# Patient Record
Sex: Female | Born: 1970 | Race: Black or African American | Hispanic: No | Marital: Single | Smoking: Never smoker
Health system: Southern US, Community
[De-identification: ages and names within clinical notes are randomized; demographics above are authoritative.]

## PROBLEM LIST (undated history)

## (undated) DIAGNOSIS — G8929 Other chronic pain: Secondary | ICD-10-CM

## (undated) DIAGNOSIS — R51 Headache: Secondary | ICD-10-CM

---

## 2014-01-29 ENCOUNTER — Emergency Department (HOSPITAL_BASED_OUTPATIENT_CLINIC_OR_DEPARTMENT_OTHER)
Admission: EM | Admit: 2014-01-29 | Discharge: 2014-01-30 | Disposition: A | Discharge status: Home / Self Care | Payer: Self-pay | Attending: Emergency Medicine | Admitting: Emergency Medicine

## 2014-01-29 ENCOUNTER — Encounter (HOSPITAL_BASED_OUTPATIENT_CLINIC_OR_DEPARTMENT_OTHER): Payer: Self-pay | Admitting: Emergency Medicine

## 2014-01-29 DIAGNOSIS — G43801 Other migraine, not intractable, with status migrainosus: Secondary | ICD-10-CM | POA: Insufficient documentation

## 2014-01-29 DIAGNOSIS — Z791 Long term (current) use of non-steroidal anti-inflammatories (NSAID): Secondary | ICD-10-CM | POA: Insufficient documentation

## 2014-01-29 DIAGNOSIS — R51 Headache: Secondary | ICD-10-CM | POA: Insufficient documentation

## 2014-01-29 HISTORY — DX: Other chronic pain: G89.29

## 2014-01-29 HISTORY — DX: Headache: R51

## 2014-01-29 MED ORDER — SODIUM CHLORIDE 0.9 % IV BOLUS (SEPSIS)
1000.0000 mL | Freq: Once | INTRAVENOUS | Status: AC
  Administered 2014-01-29: 1000 mL via INTRAVENOUS

## 2014-01-29 MED ORDER — DEXAMETHASONE SODIUM PHOSPHATE 4 MG/ML IJ SOLN
INTRAMUSCULAR | Status: AC
  Administered 2014-01-29: 10 mg
  Filled 2014-01-29: qty 3

## 2014-01-29 MED ORDER — DIPHENHYDRAMINE HCL 50 MG/ML IJ SOLN
25.0000 mg | Freq: Once | INTRAMUSCULAR | Status: AC
  Administered 2014-01-29: 25 mg via INTRAVENOUS
  Filled 2014-01-29: qty 1

## 2014-01-29 MED ORDER — METOCLOPRAMIDE HCL 5 MG/ML IJ SOLN
10.0000 mg | Freq: Once | INTRAMUSCULAR | Status: AC
  Administered 2014-01-29: 10 mg via INTRAVENOUS
  Filled 2014-01-29: qty 2

## 2014-01-29 MED ORDER — METOCLOPRAMIDE HCL 10 MG PO TABS: 10.0000 mg | ORAL_TABLET | Freq: Four times a day (QID) | ORAL | Status: AC

## 2014-01-29 MED ORDER — KETOROLAC TROMETHAMINE 30 MG/ML IJ SOLN
30.0000 mg | Freq: Once | INTRAMUSCULAR | Status: AC
  Administered 2014-01-29: 30 mg via INTRAVENOUS
  Filled 2014-01-29: qty 1

## 2014-01-29 MED ORDER — DEXAMETHASONE SODIUM PHOSPHATE 10 MG/ML IJ SOLN: 10.0000 mg | Freq: Once | INTRAMUSCULAR | Status: AC

## 2014-01-29 MED ORDER — NAPROXEN 500 MG PO TABS: 500.0000 mg | ORAL_TABLET | Freq: Two times a day (BID) | ORAL | Status: AC

## 2014-01-29 NOTE — ED Notes (Signed)
Patient here with persistent headache x 5 days, reports she has headaches but never last this long. Denies trauma. Alert and oriented. Reports nausea with same

## 2014-01-29 NOTE — ED Provider Notes (Addendum)
CSN: [id number]     Arrival date & time 01/29/14  1844 History  This chart was scribed for Rolland Porter, MD by Modena Jansky, ED Scribe. This patient was seen in room MH07/MH07 and the patient's care was started at 7:38 PM.   Chief Complaint  Patient presents with  . Headache   The history is provided by the patient. No language interpreter was used.   HPI Comments: Ashlee Moyer is a 43 y.o. female who presents to the Emergency Department complaining of constant moderate headache that started about 5 days ago. She states that she started seeing dots and having nausea when the headache first started. She denies any injury. She reports feeling pain in the back of her eyes and head. She currently rates the severity of her pain as a 8/10. She states that she took imitrex and OTC medication without relief. She reports a prior hx of headache, but never one lasting as long as today's episode. She reports associated photophobia. She denies any emesis, fever, or sore throat.   Past Medical History  Diagnosis Date  . Chronic headaches    History reviewed. No pertinent past surgical history. No family history on file. History  Substance Use Topics  . Smoking status: Never Smoker   . Smokeless tobacco: Not on file  . Alcohol Use: Not on file   OB History   Grav Para Term Preterm Abortions TAB SAB Ect Mult Living                 Review of Systems  Constitutional: Negative for fever, chills, diaphoresis, appetite change and fatigue.  HENT: Negative for mouth sores, sore throat and trouble swallowing.   Eyes: Positive for photophobia. Negative for visual disturbance.  Respiratory: Negative for cough, chest tightness, shortness of breath and wheezing.   Cardiovascular: Negative for chest pain.  Gastrointestinal: Positive for nausea. Negative for vomiting, abdominal pain, diarrhea and abdominal distention.  Endocrine: Negative for polydipsia, polyphagia and polyuria.  Genitourinary: Negative for  dysuria, frequency and hematuria.  Musculoskeletal: Negative for gait problem.  Skin: Negative for color change, pallor and rash.  Neurological: Negative for dizziness, syncope, light-headedness and headaches.  Hematological: Does not bruise/bleed easily.  Psychiatric/Behavioral: Negative for behavioral problems and confusion.      Allergies  Review of patient's allergies indicates no known allergies.  Home Medications   Prior to Admission medications   Medication Sig Start Date End Date Taking? Authorizing Provider  metoCLOPramide (REGLAN) 10 MG tablet Take 1 tablet (10 mg total) by mouth every 6 (six) hours. 01/29/14   Rolland Porter, MD  naproxen (NAPROSYN) 500 MG tablet Take 1 tablet (500 mg total) by mouth 2 (two) times daily. 01/29/14   Rolland Porter, MD   BP 147/74  Pulse 79  Temp(Src) 98.4 F (36.9 C) (Oral)  Resp 18  Wt 250 lb (113.399 kg)  SpO2 98% Physical Exam  Constitutional: She is oriented to person, place, and time. She appears well-developed and well-nourished. No distress.  HENT:  Head: Normocephalic.  Bifrontal and occipital tenderness. Photophobia.   Eyes: Conjunctivae are normal. Pupils are equal, round, and reactive to light. No scleral icterus.  3 mm reactive  Neck: Normal range of motion. Neck supple. No thyromegaly present.  Cardiovascular: Normal rate and regular rhythm.  Exam reveals no gallop and no friction rub.   No murmur heard. Pulmonary/Chest: Effort normal and breath sounds normal. No respiratory distress. She has no wheezes. She has no rales.  Abdominal: Soft. Bowel  sounds are normal. She exhibits no distension. There is no tenderness. There is no rebound.  Musculoskeletal: Normal range of motion.  Neurological: She is alert and oriented to person, place, and time.  Skin: Skin is warm and dry. No rash noted.  Psychiatric: She has a normal mood and affect. Her behavior is normal.    ED Course  Procedures (including critical care time) DIAGNOSTIC  STUDIES: Oxygen Saturation is 98% on RA, normal by my interpretation.    COORDINATION OF CARE: 7:42 PM- Pt advised of plan for treatment which includes medication and pt agrees.  Labs Review Labs Reviewed - No data to display  Imaging Review No results found.   EKG Interpretation None      MDM   Final diagnoses:  Other migraine with status migrainosus, not intractable    Complete relief after above medications and fluids. Plan is discharge home.  I personally performed the services described in this documentation, which was scribed in my presence. The recorded information has been reviewed and is accurate.     Rolland Porter, MD 01/29/14 9147  Rolland Porter, MD 01/29/14 [phone]

## 2014-01-29 NOTE — Discharge Instructions (Signed)

## 2020-10-24 ENCOUNTER — Other Ambulatory Visit: Payer: Self-pay | Admitting: Physician Assistant

## 2020-10-24 DIAGNOSIS — M2392 Unspecified internal derangement of left knee: Secondary | ICD-10-CM

## 2020-10-24 DIAGNOSIS — M2242 Chondromalacia patellae, left knee: Secondary | ICD-10-CM

## 2020-10-30 ENCOUNTER — Other Ambulatory Visit: Payer: Self-pay

## 2020-11-27 ENCOUNTER — Ambulatory Visit
Admission: RE | Admit: 2020-11-27 | Discharge: 2020-11-27 | Disposition: A | Discharge status: Home / Self Care | Payer: Medicaid Other | Source: Ambulatory Visit | Attending: Physician Assistant | Admitting: Physician Assistant

## 2020-11-27 ENCOUNTER — Other Ambulatory Visit: Payer: Self-pay

## 2020-11-27 DIAGNOSIS — M2392 Unspecified internal derangement of left knee: Secondary | ICD-10-CM

## 2020-11-27 DIAGNOSIS — M2242 Chondromalacia patellae, left knee: Secondary | ICD-10-CM

## 2022-07-11 IMAGING — MR MR KNEE*L* W/O CM
4 of 7 series · 21 of 40 positions shown · non-contrast
Comparison: Left knee x-rays dated June 15, 2020, and [facility]

CLINICAL DATA: Chronic left knee pain and swelling around the
patella for the past year. No injury or prior surgery.

EXAM:
MRI OF THE LEFT KNEE WITHOUT CONTRAST
TECHNIQUE: Multiplanar, multisequence MR imaging of the knee was performed. No
intravenous contrast was administered.

[Series 3: T2 fat-sat · axial · 4.0mm · 0.50mm/px · z∈[-38,+77]mm · 4 of 24 slices shown]
[im 1/24]
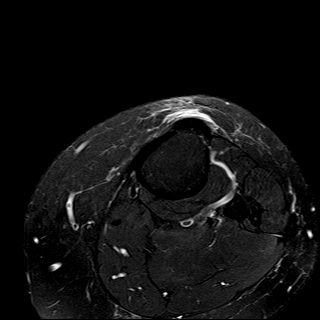
[im 5/24]
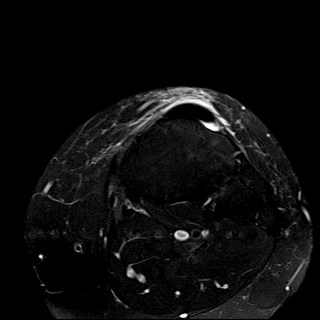
[im 14/24]
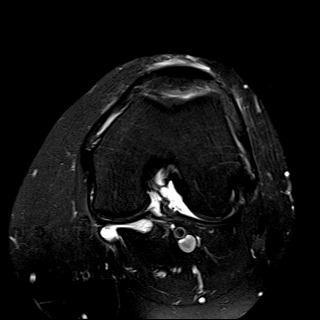
[im 24/24]
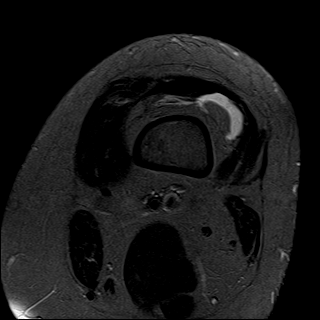

[Series 7: PD fat-sat · sagittal · 3.0mm · 0.29mm/px · 7 of 27 slices shown (1 of 3)]
[im 1/27]
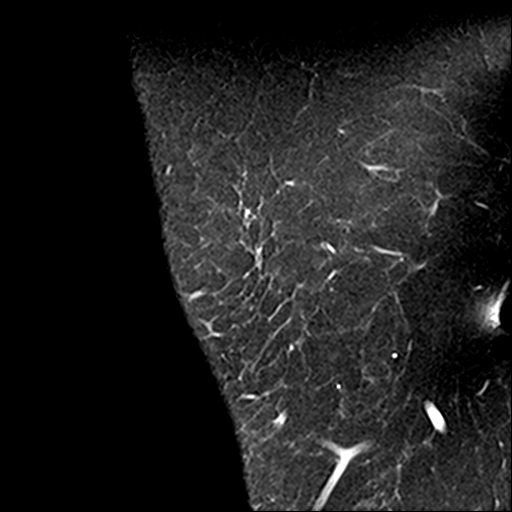
[im 5/27]
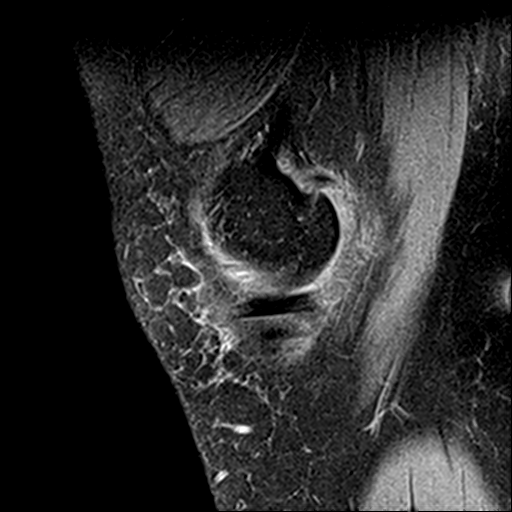
[im 9/27]
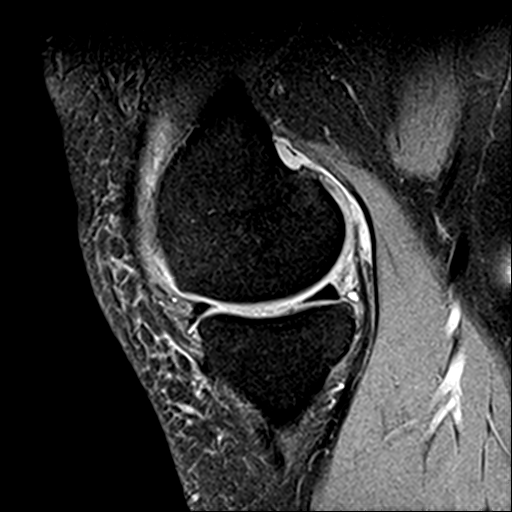
[im 14/27]
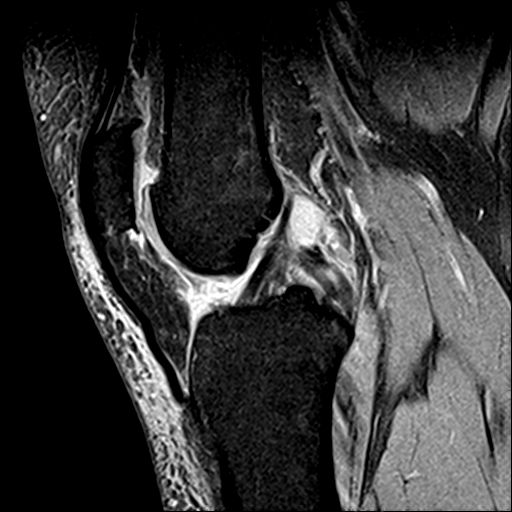
[im 18/27]
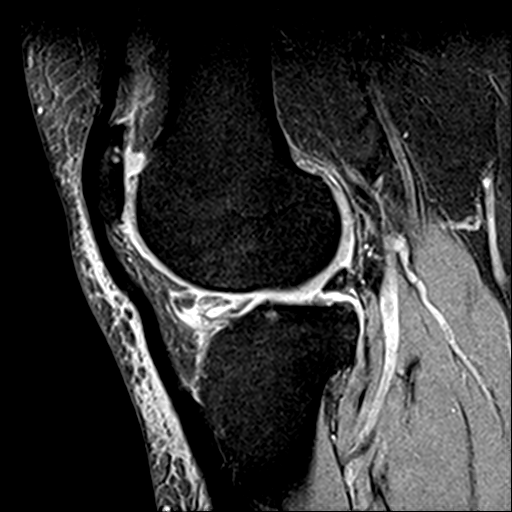
[im 22/27]
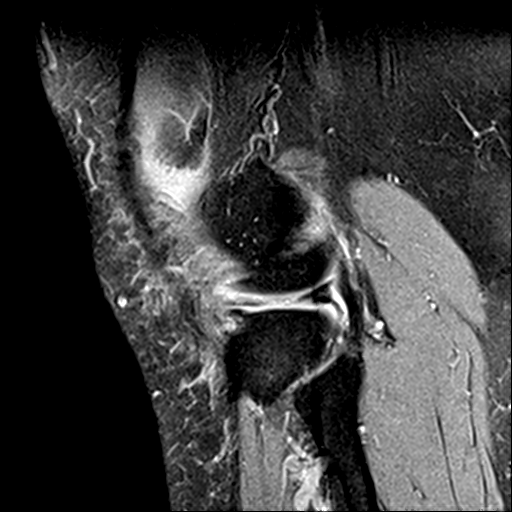
[im 27/27]
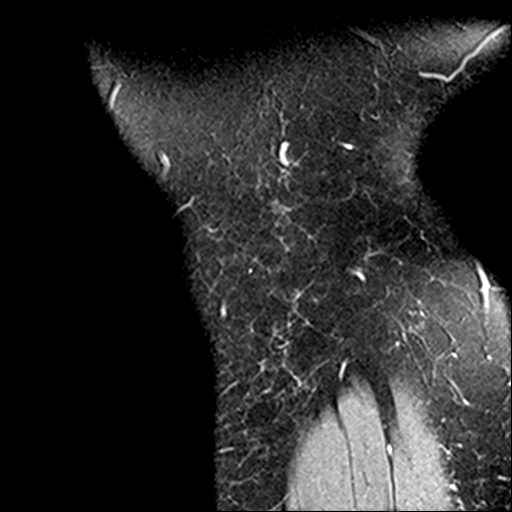

[Series 8: PD fat-sat · coronal · 3.0mm · 0.29mm/px · 7 of 28 slices shown (2 of 3)]
[im 1/28]
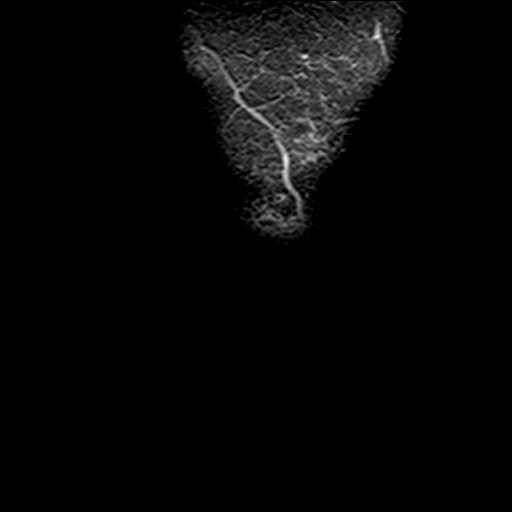
[im 5/28]
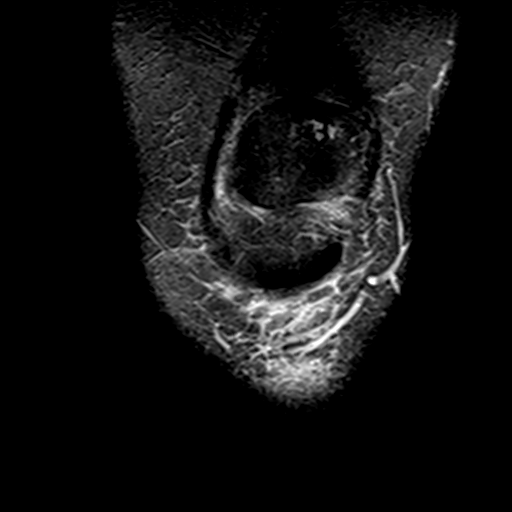
[im 10/28]
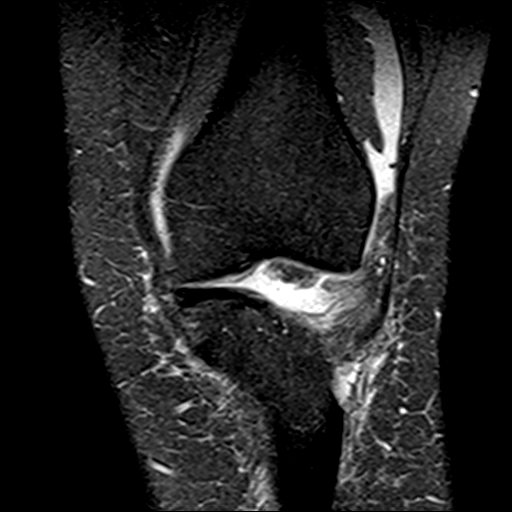
[im 14/28]
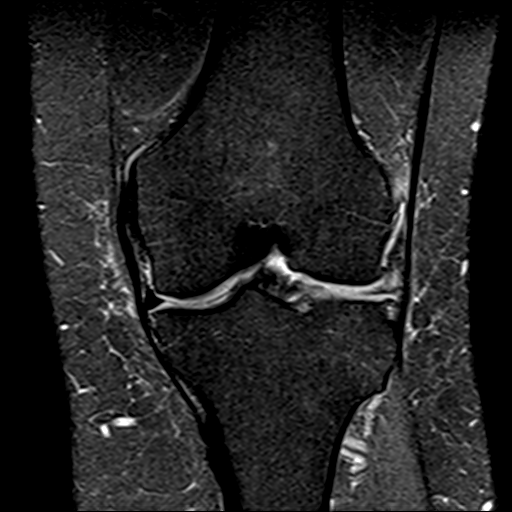
[im 19/28]
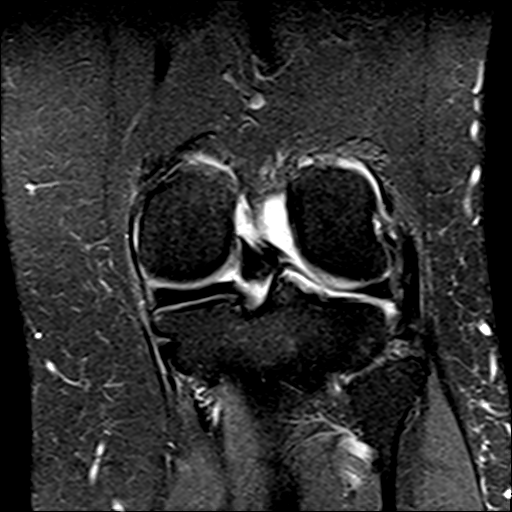
[im 23/28]
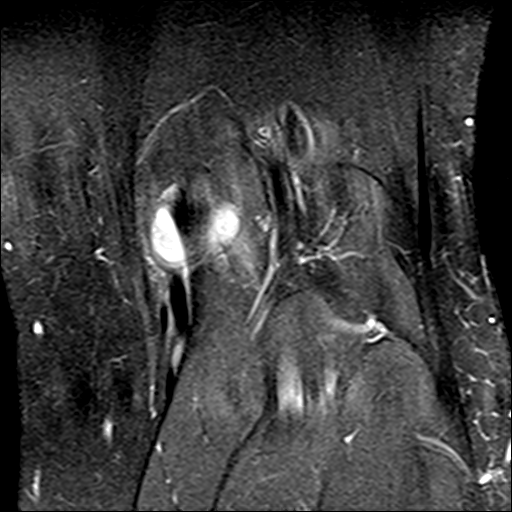
[im 28/28]
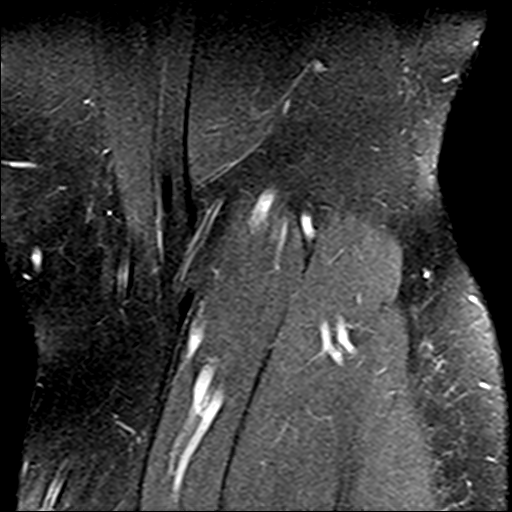

[Series 9: PD fat-sat · coronal · 2.0mm · 0.29mm/px · 3 of 11 slices shown (3 of 3)]
[im 1/11]
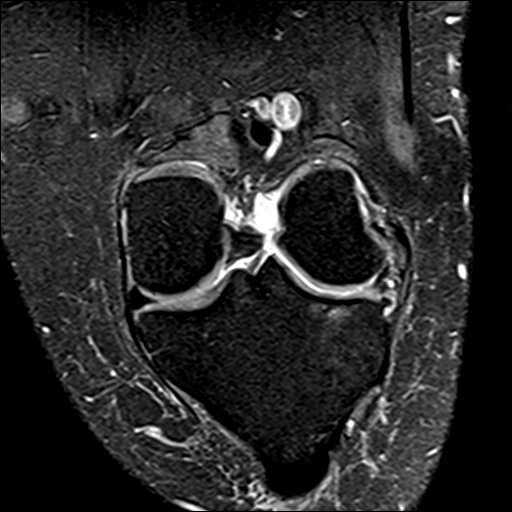
[im 6/11]
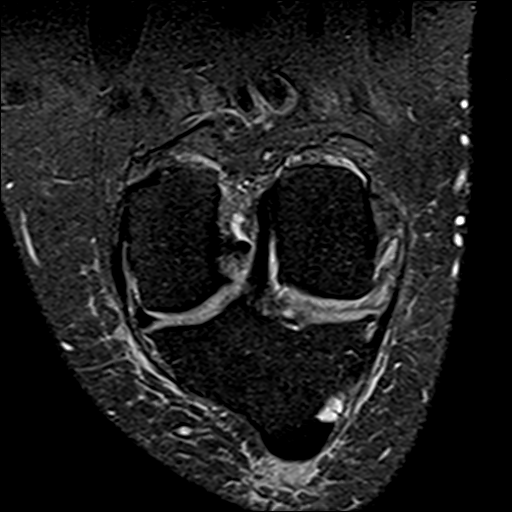
[im 11/11]
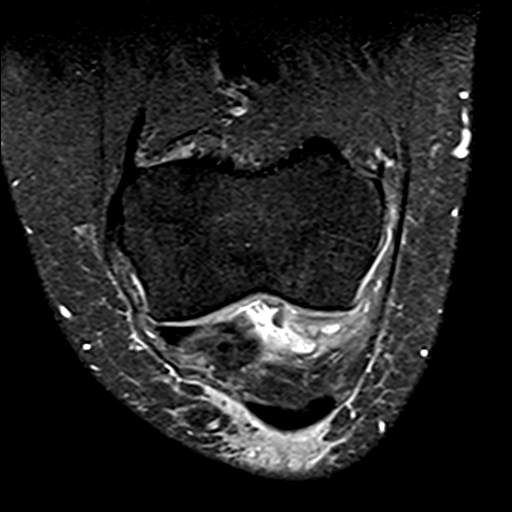

[21 of 40 positions shown; findings below may reference images not displayed]

FINDINGS: MENISCI

Medial meniscus:  Intact.

Lateral meniscus: Horizontal tear of the anterior horn and body.
Small intrameniscal cysts in the anterior horn and body. Additional
free edge fraying of the midbody.

LIGAMENTS

Cruciates:  Intact ACL and PCL.

Collaterals: Medial collateral ligament is intact. Lateral
collateral ligament complex is intact.

CARTILAGE

Patellofemoral: Full-thickness cartilage loss over the patellar apex
and lateral facet. Partial-thickness cartilage loss over the medial
patella facet and lateral trochlea.

Medial:  No chondral defect.

Lateral: Diffuse cartilage thinning and mild partial-thickness
cartilage loss over the central weight-bearing lateral femoral
condyle and lateral tibial plateau. Full-thickness cartilage loss
over the peripheral lateral femoral condyle.

Joint:  Small joint effusion.  Normal Hoffa's fat.

Popliteal Fossa:  Small Baker cyst.  Intact popliteus tendon.

Extensor Mechanism: Intact quadriceps tendon and patellar tendon.
Intact medial and lateral patellar retinaculum. Intact MPFL.

Bones: No acute fracture or dislocation. No suspicious bone lesion.

Other: None.
IMPRESSION: 1. Horizontal tear of the lateral meniscus anterior horn and body
with small intrameniscal cysts. Additional free edge fraying of the
midbody.
2. Mild-to-moderate lateral and patellofemoral compartment
osteoarthritis.
3. Small joint effusion and Baker cyst.
